# Patient Record
Sex: Female | Born: 1935 | Race: White | Hispanic: No
Health system: Southern US, Community
[De-identification: ages and names within clinical notes are randomized; demographics above are authoritative.]

---

## 2007-02-11 ENCOUNTER — Ambulatory Visit: Payer: Self-pay | Admitting: Emergency Medicine

## 2007-02-11 ENCOUNTER — Other Ambulatory Visit: Payer: Self-pay

## 2007-02-11 ENCOUNTER — Emergency Department: Payer: Self-pay | Admitting: Emergency Medicine

## 2007-09-19 ENCOUNTER — Other Ambulatory Visit: Payer: Self-pay

## 2007-09-19 ENCOUNTER — Emergency Department: Payer: Self-pay | Admitting: Emergency Medicine

## 2007-09-26 ENCOUNTER — Ambulatory Visit: Payer: Self-pay | Admitting: Family Medicine

## 2008-01-11 ENCOUNTER — Emergency Department: Payer: Self-pay | Admitting: Emergency Medicine

## 2008-01-11 ENCOUNTER — Other Ambulatory Visit: Payer: Self-pay

## 2008-02-04 ENCOUNTER — Ambulatory Visit: Payer: Self-pay | Admitting: Internal Medicine

## 2008-07-13 ENCOUNTER — Emergency Department: Payer: Self-pay | Admitting: Emergency Medicine

## 2008-07-16 ENCOUNTER — Ambulatory Visit: Payer: Self-pay | Admitting: Family Medicine

## 2008-08-13 ENCOUNTER — Ambulatory Visit: Payer: Self-pay | Admitting: Family Medicine

## 2008-09-13 ENCOUNTER — Ambulatory Visit: Payer: Self-pay | Admitting: Family Medicine

## 2010-04-22 ENCOUNTER — Emergency Department: Payer: Self-pay | Admitting: Emergency Medicine

## 2010-05-05 ENCOUNTER — Inpatient Hospital Stay: Payer: Self-pay | Admitting: Internal Medicine

## 2010-05-13 ENCOUNTER — Ambulatory Visit: Payer: Self-pay | Admitting: Cardiovascular Disease

## 2010-12-23 ENCOUNTER — Emergency Department: Payer: Self-pay | Admitting: Emergency Medicine

## 2011-05-13 ENCOUNTER — Emergency Department: Payer: Self-pay | Admitting: Emergency Medicine

## 2011-05-16 ENCOUNTER — Emergency Department: Payer: Self-pay | Admitting: Emergency Medicine

## 2011-05-22 ENCOUNTER — Ambulatory Visit: Payer: Self-pay

## 2012-05-06 LAB — URINALYSIS, COMPLETE
Bacteria: NONE SEEN
Glucose,UR: NEGATIVE mg/dL (ref 0–75)
Nitrite: NEGATIVE
RBC,UR: 3 /HPF (ref 0–5)
Squamous Epithelial: 1
WBC UR: 4 /HPF (ref 0–5)

## 2012-05-06 LAB — CK TOTAL AND CKMB (NOT AT ARMC): CK, Total: 723 U/L — ABNORMAL HIGH (ref 21–215)

## 2012-05-06 LAB — COMPREHENSIVE METABOLIC PANEL
Albumin: 3 g/dL — ABNORMAL LOW (ref 3.4–5.0)
Alkaline Phosphatase: 62 U/L (ref 50–136)
Anion Gap: 8 (ref 7–16)
BUN: 31 mg/dL — ABNORMAL HIGH (ref 7–18)
Bilirubin,Total: 0.6 mg/dL (ref 0.2–1.0)
Creatinine: 2.12 mg/dL — ABNORMAL HIGH (ref 0.60–1.30)
EGFR (African American): 23 — ABNORMAL LOW
Glucose: 110 mg/dL — ABNORMAL HIGH (ref 65–99)
Potassium: 4.6 mmol/L (ref 3.5–5.1)
SGOT(AST): 48 U/L — ABNORMAL HIGH (ref 15–37)
SGPT (ALT): 23 U/L (ref 12–78)
Total Protein: 7.3 g/dL (ref 6.4–8.2)

## 2012-05-06 LAB — CBC
HCT: 32.4 % — ABNORMAL LOW (ref 35.0–47.0)
HGB: 11.3 g/dL — ABNORMAL LOW (ref 12.0–16.0)
MCH: 29.2 pg (ref 26.0–34.0)
MCHC: 34.7 g/dL (ref 32.0–36.0)
MCV: 84 fL (ref 80–100)
RBC: 3.85 10*6/uL (ref 3.80–5.20)

## 2012-05-07 ENCOUNTER — Inpatient Hospital Stay: Payer: Self-pay | Admitting: Internal Medicine

## 2012-05-07 LAB — COMPREHENSIVE METABOLIC PANEL
Albumin: 2.8 g/dL — ABNORMAL LOW (ref 3.4–5.0)
Anion Gap: 10 (ref 7–16)
BUN: 28 mg/dL — ABNORMAL HIGH (ref 7–18)
Bilirubin,Total: 0.4 mg/dL (ref 0.2–1.0)
Chloride: 93 mmol/L — ABNORMAL LOW (ref 98–107)
Co2: 24 mmol/L (ref 21–32)
Creatinine: 2.06 mg/dL — ABNORMAL HIGH (ref 0.60–1.30)
EGFR (African American): 24 — ABNORMAL LOW
EGFR (Non-African Amer.): 21 — ABNORMAL LOW
Glucose: 89 mg/dL (ref 65–99)
Osmolality: 260 (ref 275–301)
Potassium: 4.5 mmol/L (ref 3.5–5.1)
SGPT (ALT): 21 U/L (ref 12–78)

## 2012-05-07 LAB — HEMOGLOBIN: HGB: 11.9 g/dL — ABNORMAL LOW (ref 12.0–16.0)

## 2012-05-07 LAB — TROPONIN I: Troponin-I: 0.32 ng/mL — ABNORMAL HIGH

## 2012-05-08 ENCOUNTER — Ambulatory Visit: Payer: Self-pay | Admitting: Internal Medicine

## 2012-05-08 LAB — BASIC METABOLIC PANEL
BUN: 22 mg/dL — ABNORMAL HIGH (ref 7–18)
Chloride: 100 mmol/L (ref 98–107)
Creatinine: 1.56 mg/dL — ABNORMAL HIGH (ref 0.60–1.30)
Glucose: 91 mg/dL (ref 65–99)
Potassium: 4.1 mmol/L (ref 3.5–5.1)
Sodium: 132 mmol/L — ABNORMAL LOW (ref 136–145)

## 2012-05-09 LAB — CBC WITH DIFFERENTIAL/PLATELET
Basophil #: 0 10*3/uL (ref 0.0–0.1)
HCT: 31.7 % — ABNORMAL LOW (ref 35.0–47.0)
MCH: 28 pg (ref 26.0–34.0)
MCHC: 33.1 g/dL (ref 32.0–36.0)
MCV: 84 fL (ref 80–100)
Monocyte #: 0.8 x10 3/mm (ref 0.2–0.9)
Monocyte %: 14 %
Neutrophil #: 3.9 10*3/uL (ref 1.4–6.5)
Platelet: 241 10*3/uL (ref 150–440)
RDW: 14.6 % — ABNORMAL HIGH (ref 11.5–14.5)

## 2012-05-09 LAB — BASIC METABOLIC PANEL
BUN: 17 mg/dL (ref 7–18)
Chloride: 102 mmol/L (ref 98–107)
Co2: 25 mmol/L (ref 21–32)
Creatinine: 1.38 mg/dL — ABNORMAL HIGH (ref 0.60–1.30)
EGFR (African American): 39 — ABNORMAL LOW
EGFR (Non-African Amer.): 33 — ABNORMAL LOW
Glucose: 75 mg/dL (ref 65–99)
Osmolality: 268 (ref 275–301)
Potassium: 3.9 mmol/L (ref 3.5–5.1)

## 2012-05-09 LAB — URINE CULTURE

## 2012-05-11 LAB — BASIC METABOLIC PANEL
Anion Gap: 9 (ref 7–16)
BUN: 14 mg/dL (ref 7–18)
Chloride: 104 mmol/L (ref 98–107)
Creatinine: 1.25 mg/dL (ref 0.60–1.30)
EGFR (African American): 44 — ABNORMAL LOW
EGFR (Non-African Amer.): 38 — ABNORMAL LOW
Glucose: 97 mg/dL (ref 65–99)
Osmolality: 274 (ref 275–301)

## 2012-05-11 LAB — HEMOGLOBIN: HGB: 11 g/dL — ABNORMAL LOW (ref 12.0–16.0)

## 2012-05-12 LAB — CULTURE, BLOOD (SINGLE)

## 2012-05-14 ENCOUNTER — Ambulatory Visit: Payer: Self-pay | Admitting: Internal Medicine

## 2012-10-01 ENCOUNTER — Emergency Department: Payer: Self-pay | Admitting: Emergency Medicine

## 2012-10-01 LAB — URINALYSIS, COMPLETE
Bilirubin,UR: NEGATIVE
Ketone: NEGATIVE
Leukocyte Esterase: NEGATIVE
Nitrite: NEGATIVE
Protein: NEGATIVE
RBC,UR: <1 /HPF (ref 0–5)
Squamous Epithelial: <1
WBC UR: <1 /HPF (ref 0–5)

## 2012-10-01 LAB — COMPREHENSIVE METABOLIC PANEL
Albumin: 3.2 g/dL — ABNORMAL LOW (ref 3.4–5.0)
Alkaline Phosphatase: 55 U/L (ref 50–136)
Anion Gap: 7 (ref 7–16)
Chloride: 105 mmol/L (ref 98–107)
Co2: 24 mmol/L (ref 21–32)
EGFR (African American): 35 — ABNORMAL LOW
EGFR (Non-African Amer.): 30 — ABNORMAL LOW
Glucose: 82 mg/dL (ref 65–99)
SGOT(AST): 53 U/L — ABNORMAL HIGH (ref 15–37)
SGPT (ALT): 20 U/L (ref 12–78)
Total Protein: 7.4 g/dL (ref 6.4–8.2)

## 2012-10-01 LAB — CBC
HCT: 35.8 % (ref 35.0–47.0)
MCH: 26.1 pg (ref 26.0–34.0)
MCHC: 32.8 g/dL (ref 32.0–36.0)
MCV: 80 fL (ref 80–100)
Platelet: 269 10*3/uL (ref 150–440)
RBC: 4.5 10*6/uL (ref 3.80–5.20)
RDW: 14.9 % — ABNORMAL HIGH (ref 11.5–14.5)

## 2012-10-01 LAB — LIPASE, BLOOD: Lipase: 72 U/L — ABNORMAL LOW (ref 73–393)

## 2012-11-20 ENCOUNTER — Ambulatory Visit: Payer: Self-pay

## 2013-02-23 ENCOUNTER — Inpatient Hospital Stay: Payer: Self-pay | Admitting: Internal Medicine

## 2013-02-23 LAB — URINALYSIS, COMPLETE
Bilirubin,UR: NEGATIVE
Nitrite: POSITIVE
Ph: 5 (ref 4.5–8.0)
RBC,UR: 2 /HPF (ref 0–5)
Specific Gravity: 1.013 (ref 1.003–1.030)
Squamous Epithelial: 1

## 2013-02-23 LAB — COMPREHENSIVE METABOLIC PANEL
Albumin: 2.9 g/dL — ABNORMAL LOW (ref 3.4–5.0)
Alkaline Phosphatase: 49 U/L — ABNORMAL LOW (ref 50–136)
Anion Gap: 9 (ref 7–16)
BUN: 32 mg/dL — ABNORMAL HIGH (ref 7–18)
Creatinine: 1.75 mg/dL — ABNORMAL HIGH (ref 0.60–1.30)
EGFR (African American): 29 — ABNORMAL LOW
Potassium: 4.4 mmol/L (ref 3.5–5.1)
SGPT (ALT): 29 U/L (ref 12–78)
Total Protein: 6.5 g/dL (ref 6.4–8.2)

## 2013-02-23 LAB — TROPONIN I: Troponin-I: <0.02 ng/mL

## 2013-02-23 LAB — PROTIME-INR
INR: 1
Prothrombin Time: 13.2 secs (ref 11.5–14.7)

## 2013-02-23 LAB — CBC
HCT: 34.1 % — ABNORMAL LOW (ref 35.0–47.0)
HGB: 11.3 g/dL — ABNORMAL LOW (ref 12.0–16.0)
MCV: 81 fL (ref 80–100)
RBC: 4.19 10*6/uL (ref 3.80–5.20)
RDW: 18.1 % — ABNORMAL HIGH (ref 11.5–14.5)

## 2013-02-23 LAB — TSH: Thyroid Stimulating Horm: 1.54 u[IU]/mL

## 2013-02-23 LAB — CK TOTAL AND CKMB (NOT AT ARMC): CK-MB: 1.6 ng/mL (ref 0.5–3.6)

## 2013-02-24 LAB — BASIC METABOLIC PANEL
Anion Gap: 8 (ref 7–16)
BUN: 25 mg/dL — ABNORMAL HIGH (ref 7–18)
Calcium, Total: 9.1 mg/dL (ref 8.5–10.1)
Chloride: 108 mmol/L — ABNORMAL HIGH (ref 98–107)
Co2: 24 mmol/L (ref 21–32)
EGFR (African American): 29 — ABNORMAL LOW
Glucose: 78 mg/dL (ref 65–99)
Osmolality: 283 (ref 275–301)
Potassium: 4 mmol/L (ref 3.5–5.1)
Sodium: 140 mmol/L (ref 136–145)

## 2013-02-24 LAB — CBC WITH DIFFERENTIAL/PLATELET
Eosinophil #: 0.1 10*3/uL (ref 0.0–0.7)
Eosinophil %: 1.5 %
HCT: 33.9 % — ABNORMAL LOW (ref 35.0–47.0)
HGB: 11.8 g/dL — ABNORMAL LOW (ref 12.0–16.0)
Lymphocyte %: 30.9 %
MCH: 28.1 pg (ref 26.0–34.0)
Monocyte #: 0.7 x10 3/mm (ref 0.2–0.9)
Monocyte %: 11.9 %
Neutrophil #: 3 10*3/uL (ref 1.4–6.5)
Neutrophil %: 54.9 %
Platelet: 210 10*3/uL (ref 150–440)
RBC: 4.21 10*6/uL (ref 3.80–5.20)
RDW: 17.4 % — ABNORMAL HIGH (ref 11.5–14.5)
WBC: 5.5 10*3/uL (ref 3.6–11.0)

## 2013-02-25 LAB — BASIC METABOLIC PANEL
Anion Gap: 5 — ABNORMAL LOW (ref 7–16)
Calcium, Total: 8.7 mg/dL (ref 8.5–10.1)
Chloride: 108 mmol/L — ABNORMAL HIGH (ref 98–107)
Co2: 27 mmol/L (ref 21–32)
EGFR (African American): 31 — ABNORMAL LOW
EGFR (Non-African Amer.): 27 — ABNORMAL LOW
Potassium: 3.8 mmol/L (ref 3.5–5.1)
Sodium: 140 mmol/L (ref 136–145)

## 2013-02-26 LAB — BASIC METABOLIC PANEL
Anion Gap: 8 (ref 7–16)
Calcium, Total: 8.6 mg/dL (ref 8.5–10.1)
EGFR (African American): 31 — ABNORMAL LOW
EGFR (Non-African Amer.): 27 — ABNORMAL LOW
Glucose: 95 mg/dL (ref 65–99)
Sodium: 141 mmol/L (ref 136–145)

## 2013-02-28 LAB — BASIC METABOLIC PANEL
Anion Gap: 7 (ref 7–16)
BUN: 14 mg/dL (ref 7–18)
Chloride: 113 mmol/L — ABNORMAL HIGH (ref 98–107)
Co2: 24 mmol/L (ref 21–32)
Creatinine: 1.49 mg/dL — ABNORMAL HIGH (ref 0.60–1.30)
EGFR (Non-African Amer.): 30 — ABNORMAL LOW
Osmolality: 286 (ref 275–301)
Potassium: 3.4 mmol/L — ABNORMAL LOW (ref 3.5–5.1)

## 2013-03-01 LAB — CULTURE, BLOOD (SINGLE)

## 2013-07-07 ENCOUNTER — Emergency Department: Payer: Self-pay | Admitting: Emergency Medicine

## 2013-07-08 LAB — COMPREHENSIVE METABOLIC PANEL
Anion Gap: 8 (ref 7–16)
Calcium, Total: 8.9 mg/dL (ref 8.5–10.1)
Chloride: 106 mmol/L (ref 98–107)
Co2: 26 mmol/L (ref 21–32)
Glucose: 105 mg/dL — ABNORMAL HIGH (ref 65–99)
Osmolality: 284 (ref 275–301)
Potassium: 3.9 mmol/L (ref 3.5–5.1)
SGPT (ALT): 20 U/L (ref 12–78)
Sodium: 140 mmol/L (ref 136–145)
Total Protein: 6.5 g/dL (ref 6.4–8.2)

## 2013-07-08 LAB — CBC
HCT: 36.9 % (ref 35.0–47.0)
HGB: 12.4 g/dL (ref 12.0–16.0)
MCH: 27.3 pg (ref 26.0–34.0)
MCHC: 33.6 g/dL (ref 32.0–36.0)
RDW: 15.8 % — ABNORMAL HIGH (ref 11.5–14.5)

## 2013-07-08 LAB — URINALYSIS, COMPLETE
Ketone: NEGATIVE
Nitrite: POSITIVE
Protein: NEGATIVE
WBC UR: 4 /HPF (ref 0–5)

## 2013-07-08 LAB — LIPASE, BLOOD: Lipase: 70 U/L — ABNORMAL LOW (ref 73–393)

## 2014-02-05 IMAGING — CR DG CHEST 1V PORT
1 series · 1 of 1 positions shown · non-contrast
Comparison: none

REASON FOR EXAM: Chest Pain
COMMENTS:

PROCEDURE:     DXR - DXR PORTABLE CHEST SINGLE VIEW  - February 23, 2013 [DATE]
RESULT:     Comparison: 05/06/2012, 05/22/2011

[ap]
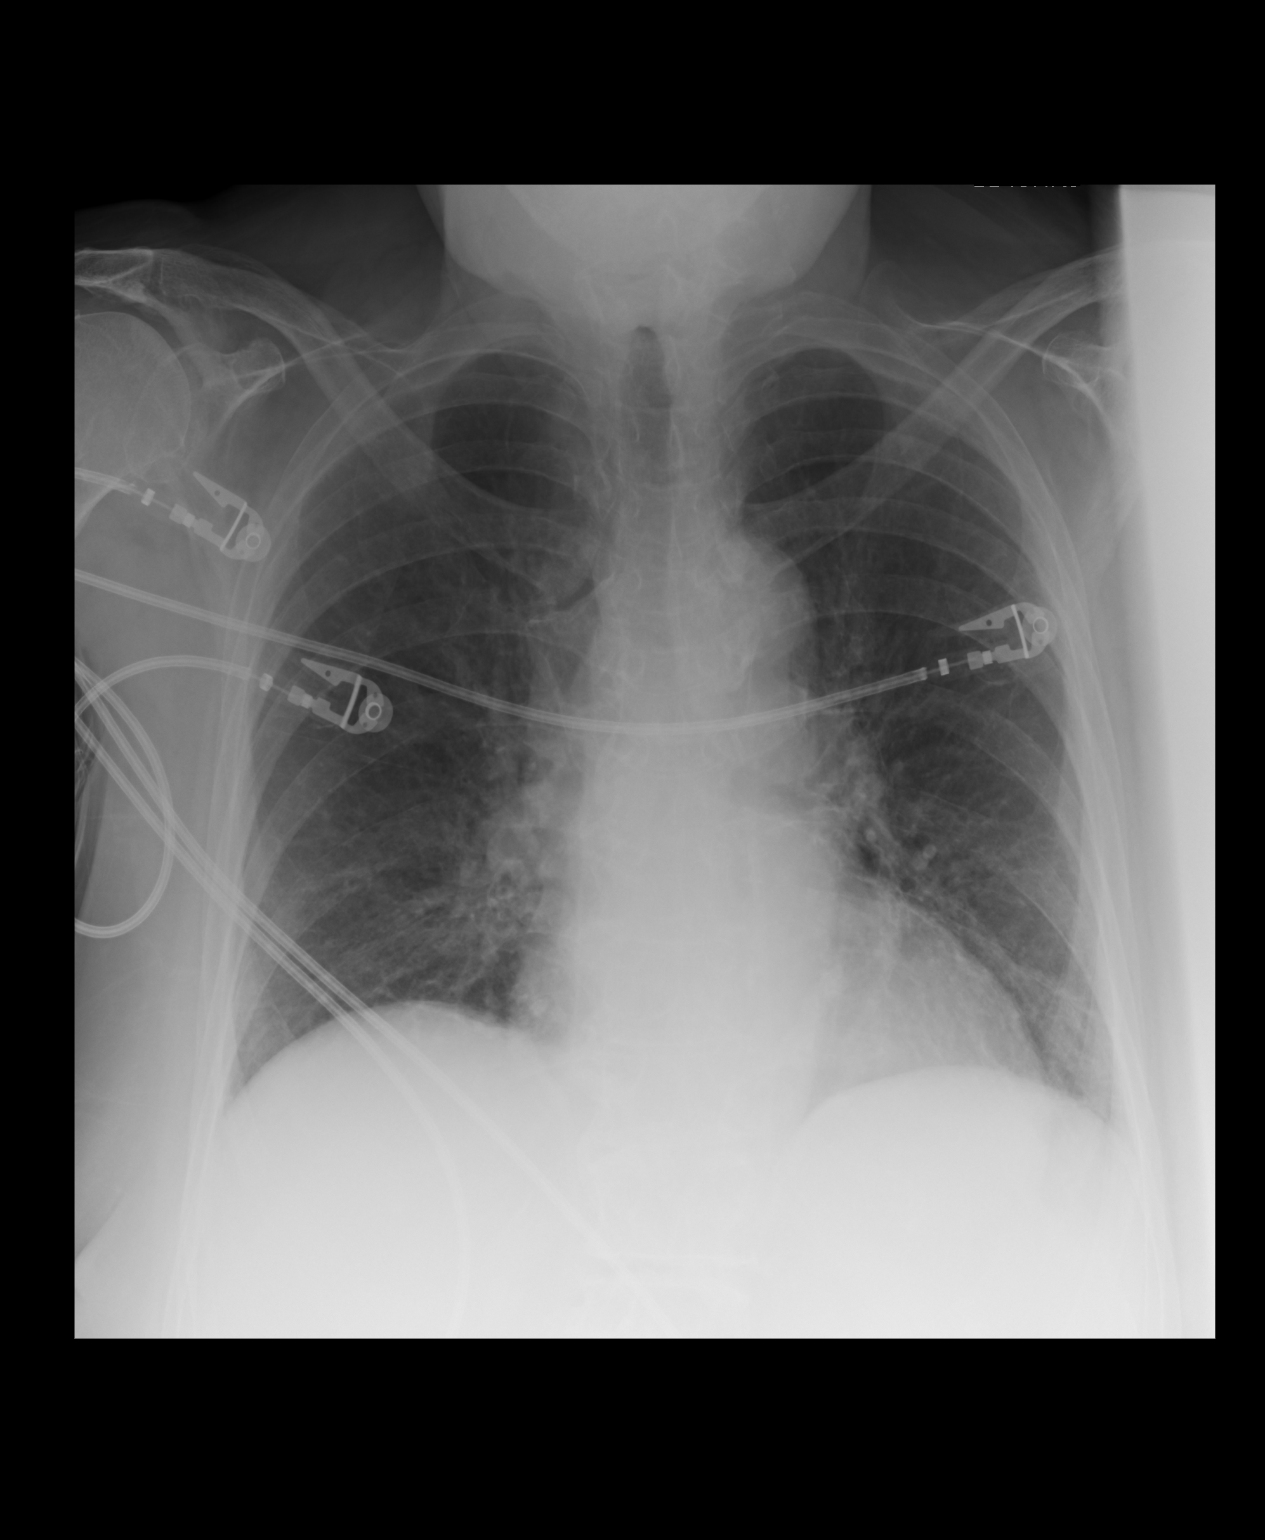

[1 of 1 positions shown; findings below may reference images not displayed]

FINDINGS: Single portable AP chest radiograph is provided. There is hazy left lower
lobe airspace disease. There is no pleural effusion or pneumothorax. Normal
cardiomediastinal silhouette. The osseous structures are unremarkable.
IMPRESSION: Hazy left lower lobe airspace disease which may reflect atelectasis versus
pneumonitis versus chronic interstitial disease. Recommend followup
radiography to document complete resolution following adequate medical
therapy.

[REDACTED]

## 2014-12-31 NOTE — Discharge Summary (Signed)
PATIENT NAME:  Amanda Steele, Amanda Steele MR#:  [id number] DATE OF BIRTH:  Feb 23, 1919  DATE OF ADMISSION:  05/07/2012 DATE OF DISCHARGE:  05/12/2012  ADMITTING DIAGNOSES:  1. Altered mental status. 2. Right upper lobe bacterial pneumonia.  3. Hypertension.  4. Hypothyroidism.   DISCHARGE DIAGNOSES:  1. Delirium. 2. Aspiration pneumonia. 3. Dysphagia.  4. Urinary retention. Patient will be using Foley catheter from now on, chronic indwelling catheter.  5. Acute renal failure.  6. Hyponatremia.  7. Dehydration, resolved on intravenous fluids. 8. Generalized weakness. 9. History of hypertension. 10. Cerebrovascular accident. 11. Hypothyroidism.  12. Dementia.  13. Failure to thrive.   DISCHARGE CONDITION: Guarded.   DISCHARGE MEDICATIONS: Patient is to resume her outpatient medications and continue to those medications prescribed. 1. Synthroid 25 mcg p.o. daily.  2. Aspirin 81 mg p.o. daily.  3. Aloe Vera oral capsule 2 capsules once daily.  4. Zyrtec 10 mg p.o. daily. 5. Namenda 10 mg p.o. twice daily.  6. Pantoprazole 20 mg p.o. daily.  7. Citalopram 10 mg p.o. daily.  8. Amlodipine 5 mg p.o. daily.  9. Augmentin 400/57 mg 1 tablet every eight hours for five more days.  10. Patient is to stop amitriptyline as well as homatropine/hydrocodone.   HOME OXYGEN: None.   DIET: 2 grams salt, low fat, low cholesterol, pureed honey-thick liquids. Aspiration precautions with feedings at all meals.   ACTIVITY LIMITATIONS: As tolerated.   REFERRAL: Hospice at home.   FOLLOW UP: Follow-up appointment with Dr. [name] in two days after discharge.   DISCHARGE INSTRUCTIONS: Patient's Foley catheter should be kept in place. Hospice it to replace it as needed.   HISTORY OF PRESENT ILLNESS: Patient is a 81 year old Caucasian female who has been declining over a period of time now presents to the hospital with complaints of altered mental status, also cough, low-grade fevers. Please refer to Dr.  Riley Steele's admission note on 05/07/2012.  On arrival to the hospital patient's blood pressure 120/56, respiration rate 18, pulse 69, temperature 99.6, oxygen saturation 94% oxygen therapy. Physical examination was unremarkable. Patient was demented, not providing much of history or communicating.   LABORATORY, DIAGNOSTIC AND RADIOLOGICAL DATA: Glucose 110, BUN 31, creatinine 2.12, sodium 124, potassium 4.6, estimated GFR for non-African American would be 20. Liver enzymes show albumin level 3.0, AST slightly elevated at 48. Patient's CK total was high as 723, MB fraction 9.0 and troponin was 0.76. Repeated troponin was 0.45 and third set of troponin was 0.32. Patient's TSH was normal at 3.53. CBC: White blood cell count 8.8, hemoglobin 11.3, platelet count 188. Patient's blood cultures x3 did not show any growth as well as urine cultures. Urinalysis showed yellow cloudy urine, negative for glucose, bilirubin or ketones, specific gravity 1.012, pH 5.0, 1+ blood, 30 mg/dL protein, negative for nitrites, trace leukocyte esterase, 3 red blood cells, 4 white blood cells, no bacteria, 1 epithelial cell, 6 hyaline casts and amorphous crystal was present. EKG showed normal sinus rhythm at 70 beats minutes, possible anterior infarct which is already cited in 2012. Abnormal EKG but no significant change since prior EKG done in September 2012. Patient's chest x-ray done on 05/06/2012 PA and lateral revealed findings consistent with interstitial infiltrate with linear component in the right lung apex, right and left lung base regions. These areas may alternatively may represent areas of discoid  atelectasis. No focal regions of consolidation were identified. Repeat evaluation post appropriate therapeutic regimen was recommended. A CT scan of her head without contrast because  of altered mental status showed no acute abnormality.   HOSPITAL COURSE: Patient was admitted to the hospital, started on therapy for suspected  aspiration pneumonitis. She was evaluated by speech therapist who recommended puree diet and honey thick liquids. Patient underwent CT scanning of her chest without contrast 05/08/2012 which showed right upper lobe infectious versus malignant process. Correlate with clinical and laboratory data according to radiologist. Follow-up chest x-ray to document clearing would be recommended. Bilateral pleural effusions were present and small amount on the right and trace amount on the left. Additional areas of atelectasis or minimal infiltrate were seen in both lower lobes as well. Study was again degraded by patient's respiratory motion artifact. Because of right upper lobe abnormalities concerning for cancer consultation with Dr. Freda Steele was obtained. Consultant felt that patient could have reactivation of tuberculosis so patient was placed in respiratory isolation and had PPD placed which was negative. She also had QuantiFERON checked which was also negative. As those studies were negative it was felt that patient likely had just aspiration pneumonitis itself and patient's antibiotic was changed to Augmentin. To further evaluate reasons of her altered mental status and rule out cerebrovascular accident MRI was performed. MRI of brain without contrast done on 05/08/2012 showed atrophy with chronic small vessel ischemic disease but no evidence of acute or subacute ischemic event or evidence of hemorrhage. It was felt that patient has been declining and very likely dementia is responsible for her decline as well as aspiration pneumonitis. Patient was advised to continue antibiotics for a few more days as well as strict puree diet with aspiration precautions. Patient was felt to be candidate for hospice care and consultation with palliative care physician, Dr. Harvie Steele, was obtained. Family was agreeable for patient to go home with her son and follow up with hospice care at home. Patient is being discharged with hospice  care. Of note, patient was noted to have significant urinary retention. Initially patient's urinary retention was attributed to amitriptyline, however, after holding amitriptyline for a few days patient's urinary retention recurred and patient had to have Foley catheter placed. It was felt because of her physical decline that patient should continue Foley catheter as outpatient indefinitely. Her Foley catheter should be taken care of by hospice nursing staff. Patient is being discharged in stable cardiac condition with above-mentioned medications and follow up. Her temperature on the day of discharge is ranging from 98.2 to 99.1, pulse 60s to 70s, respiration rate 18 to 19, blood pressure around 130/60s to 70s, oxygen saturation 93% to 94% on room air at rest.     TIME SPENT: 40 minutes.    ____________________________ Katharina Caper, MD rv:cms D: 05/13/2012 14:46:26 ET T: 05/16/2012 11:29:30 ET JOB#: [id number]  cc: Katharina Caper, MD, <Dictator> Burley Saver, MD Tallin Hart MD ELECTRONICALLY SIGNED 05/24/2012 22:11

## 2014-12-31 NOTE — H&P (Signed)
PATIENT NAME:  Amanda Steele, Amanda Steele MR#:  [id number] DATE OF BIRTH:  [birth year]  DATE OF ADMISSION:  05/07/2012  PRIMARY CARE PHYSICIAN: Dr. Clayborn Bigness   REFERRING PHYSICIAN: Dr. Margarita Grizzle    CHIEF COMPLAINT: Altered mental status, cough, and low-grade fever.   HISTORY OF PRESENT ILLNESS: Amanda Steele is a 81 year old Caucasian female with a history of dementia, systemic hypertension, hypothyroidism, chronic kidney disease, and history of stroke. The patient lives at home with her son and she was noticed to have altered mental status. She has decreased oral intake, not eating and drinking as usual. Her speech is a little slurred. Her walk looks different and she's weak. Her conversation is different and noted to have cough and low-grade fever. The patient was brought here for evaluation and chest x-ray revealed right upper lobe consolidation consistent with pneumonia. Her sodium was low at 124. Troponin was noted to be elevated at 0.7. Her total CPK is also elevated to 700 consistent with rhabdomyolysis as well. The patient was admitted for further evaluation and management. It is also noted that her creatinine is elevated. The baseline is 1.5 a year ago and now it is 2.1.   REVIEW OF SYSTEMS: 10 point system review was unobtainable due to her dementia.   PAST MEDICAL HISTORY:  1. Dementia. 2. Cerebrovascular accident.  3. Systemic hypertension. 4. Hypothyroidism. 5. Chronic kidney disease, stage III. 6. History of arthritis.   PAST SURGICAL HISTORY:  1. Appendectomy.  2. History of colostomy. The indication is unknown.   SOCIAL HABITS: Nonsmoker. No history of alcohol abuse.   SOCIAL HISTORY: She lives at home with her son.   FAMILY HISTORY: According to the medical records, her mother suffered from stroke. No other significant family history of any illness.   ADMISSION MEDICATIONS:  1. Citalopram 10 mg a day. 2. Levothyroxine 25 mcg once a day. 3. Pantoprazole 20 mg a  day. 4. Zyrtec 10 mg a day. 5. Amitriptyline 25 mg at night. 6. Oxybutynin 5 mg twice a day. 7. Namenda 10 mg twice a day.  8. Recently started on cough syrup using homatropine with hydrocodone p.r.n.   ALLERGIES: She is allergic to Valium which affects her mental status and codeine of unknown reaction. Also, hydrocodone. Of note, her cough syrup contains hydrocodone.   PHYSICAL EXAMINATION:   VITAL SIGNS: Blood pressure 120/56, respiratory rate 18, pulse 69, temperature 99.6, oxygen saturation 94%.   GENERAL APPEARANCE: Elderly lady lying in bed in no acute distress.   HEAD: No pallor. No icterus. No cyanosis.   EARS, NOSE, AND THROAT: Hearing was normal. Nasal mucosa, lips, tongue were normal.   EYES: Normal iris and conjunctivae. Pupils about 4 to 5 mm, equal and sluggishly reactive to light.   NECK: Supple. Trachea at midline. No thyromegaly. No cervical lymphadenopathy. No masses.   HEART: Normal S1, S2. No S3, S4. No murmur. No gallop. No carotid bruits.   RESPIRATORY: Normal breathing pattern without use of accessory muscles. No rales. No wheezing.   ABDOMEN: Soft without tenderness. No hepatosplenomegaly. No masses. No hernias.   SKIN: No ulcers. No subcutaneous nodules.   MUSCULOSKELETAL: No joint swelling. No clubbing.   NEUROLOGIC: Cranial nerves II through XII are intact. No focal motor deficit.   PSYCHIATRIC: The patient has total dementia. This appears worse. She cannot recognize her grandson who is next to her bed, whereas her baseline usually she knows him by name. She is disoriented to place and time as well. Mood and affect  were normal.   LABORATORY, DIAGNOSTIC, AND RADIOLOGICAL DATA: CAT scan of the head showed no acute intracranial abnormality. There are white matter changes consistent with chronic ischemia.   Her chest x-ray showed linear consolidation at the right upper lobe consistent with pneumonia. I cannot rule out underlying mass. There is also a  linear density at the right lower lung zone, may indicate some atelectasis.   EKG showed normal sinus rhythm at rate of 70 per minute. Poor progression of R waves in the anterior chest leads, otherwise unremarkable EKG.  Serum glucose 110, BUN 31, creatinine 2.1. Her baseline creatinine in September 2012 was 1.5. Sodium 124, potassium 4.6. Her estimated GFR is 20. Calculated serum osmolality 257. Liver function tests were normal except for low albumin at 3.0 and 48 of AST with normal ALT at 23. CPK is elevated at 723. Troponin elevated at 0.7. TSH was normal at 3.5. CBC showed white count of 8000, hemoglobin 11, hematocrit 32, platelet count 188. Urinalysis was unremarkable.   ASSESSMENT:  1. Altered mental status. 2. Right upper lobe consolidation consistent with pneumonia.  3. Hyponatremia.  4. Elevated troponin.  5. Rhabdomyolysis.  6. Acute on chronic renal failure.  7. Dementia.  8. Systemic hypertension.  9. History of stroke.  10. Hypothyroidism.   PLAN:  1. Admit to the medical floor.  2. Follow-up on neurologic status.  3. Blood cultures were taken and urine culture as well.  4. IV antibiotics using Rocephin and Zithromax.  5. Gentle IV hydration with normal saline and follow-up on her kidney function. Hopefully the creatinine will improve.  6. Follow-up on sodium. If there is no improvement in the sodium, then she will need work-up for syndrome of inappropriate ADH secretion.  7. The elevated troponin could be from her rhabdomyolysis in addition to the worsening of her kidney function. However, I will follow-up on her troponin q.8 hours times additional two samples. Meanwhile, I will increase her aspirin to 325 mg a day.   I discussed the findings with her grandson and answered his questions. He informs me that she does have a LIVING WILL and she had appointed her son to have the power-of-attorney.   CODE STATUS: FULL CODE.   TIME SPENT: Time Spent evaluating this patient and  reviewing medical records took more than 55 minutes.   ____________________________ Carney CornersAmir M. Rudene Rearwish, MD amd:drc D: 05/07/2012 01:07:37 ET T: 05/07/2012 09:37:56 ET JOB#: [id number]  cc: Carney CornersAmir M. Rudene Rearwish, MD, <Dictator> Burley SaverL. Katherine Bliss, MD Karolee OhsAMIR Dala DockM Rafay Dahan MD ELECTRONICALLY SIGNED 05/07/2012 22:29

## 2015-01-03 NOTE — Consult Note (Signed)
PATIENT NAME:  Amanda Steele, Amanda Steele DATE OF BIRTH:  Jul 30, 1919  DATE OF CONSULTATION:  02/28/2013  CONSULTING PHYSICIAN:  Izola PriceFrances C. Jaclynn MajorGreason, MD  CHIEF COMPLAINT:  Amanda Steele is unable to answer questions. Her thinking processes are not linear, nor logical. She appears to be a very pleasant, cooperative woman.   HISTORY OF PRESENT ILLNESS: This is a 81 year old female who has vascular dementia. She was brought into the hospital on 02/23/2013 and her son, with whom she lives, was also brought in as he has a diagnosis of paranoid schizophrenia. She, at that, time had a heart rate of 50, was in acute renal failure. She was diagnosed later in her hospitalization for lower lobe pneumonia.   HISTORY OF PSYCHIATRIC ILLNESS: There is no one that can give me a history of her psychiatric history, so I do not know that she has one. I also do not know that she came in on medications. However, her medications, when I saw her, were Remeron 15 mg at bedtime, cetirizine 10 mg p.o. daily, enteric-coated aspirin 81 mg p.o. daily, Namenda 10 mg p.o. b.i.d. and pantoprazole 20 mg p.o. daily.   IT SHOULD BE NOTED THAT HER ALLERGIES ARE VALIUM THAT ALTERS HER MENTAL STATUS, CODEINE AND HYDROCODONE.   During her stay on the unit, she was diagnosed with:  1.  ARS on CKD, dehydration.  2.  Left lower lobe pneumonia.  3. Severe vascular dementia.   As I said, Amanda Steele is unable to participate in the interview. She talks in a nonlinear nonlogical fashion and it is clear that she does not understand my questions.   RECOMMENDATIONS:   1.  I recommend a skilled nursing facility, such as [facility] for Amanda Steele.  2.  I understand that [place] and that  power of attorney will be transferred to her daughter. I believe that Amanda Steele lacks the capacity to be in charge of her own affairs.   ____________________________ Izola PriceFrances C. Jaclynn MajorGreason,  MD fcg:cc D: 03/19/2013 13:16:48 ET T: 03/19/2013 14:10:37 ET JOB#: [id number]  cc: Izola PriceFrances C. Jaclynn MajorGreason, MD, <Dictator> Maryan PulsFRANCES C Jayvan Mcshan MD ELECTRONICALLY SIGNED 03/21/2013 10:49

## 2015-01-03 NOTE — H&P (Signed)
PATIENT NAME:  Amanda Steele, Amanda Steele MR#:  [id number] DATE OF BIRTH:  1919/06/29  DATE OF ADMISSION:  02/23/2013  ADMITTING PHYSICIAN: Enid Baasadhika Jazzmin Newbold, MD  PRIMARY CARE PHYSICIAN: Burley SaverL. Katherine Bliss, MD  CHIEF COMPLAINT: Is brought in by EMS secondary to bradycardia and weakness.   HISTORY OF PRESENT ILLNESS: Amanda Steele is a 81 year old, pleasant Caucasian female with history of severe vascular dementia, history of hypertension and diverticulosis, status post colostomy done in the past, who lives at home with her son, was brought in by EMS this morning. The patient was unable to provide any history due to her dementia. Most of the history is obtained from her daughter and grandchildren at bedside. The patient has been living with her son for the past 2 years after she had a stroke. Her son, who is an ex-paramedic, has severe paranoid schizophrenia. He has been calling police every day with severe paranoid thoughts and this morning, he called police saying that he has been having auditory hallucinations asking him to kill his mother. Police arrived on scene and saw the patient appeared to be extremely weak in bed, so they called paramedics. Paramedics brought Amanda Steele to the ER. She was found to be bradycardic in the 50s and blood work here revealed acute renal failure and urinary tract infection. The patient's son was also involuntarily committed for his schizophrenia at this time.   PAST MEDICAL HISTORY: 1.  Severe vascular dementia.  2.  History of CVA with no residual neurological deficits.  3.  Chronic kidney disease.   PAST SURGICAL HISTORY: Colostomy done for recurrent diverticulitis.   ALLERGIES: CODEINE, HYDROCODONE AND VALIUM.  HOME MEDICATIONS:  1.  Vitamin D3, 1000 international units p.o. daily.  2.  Vitamin B12, 2000 mcg p.o. daily.  3.  Protonix 20 mg p.o. daily.  4.  Namenda 10 mg p.o. b.i.d.  5.  Homatropine/hydrocodone oral syrup 2.5 mL every 6 to 8 hours as needed.  6.   Coenzyme Q 100 mg capsule p.o. daily.  7.  Celexa 20 mg p.o. daily.  8.  Cetirizine 10 mg p.o. daily.  9.  Aspirin 81 mg p.o. daily.   SOCIAL HISTORY: The patient was living at home with her son. She also has a daughter, but the daughter and her family are afraid of the son so they have never been able to go and visit her recently. According to the daughter, the patient's dementia is very severe, only able to recognize family members' faces but not able to maintain a conversation. She is pleasantly confused at baseline. No history of any smoking or alcohol use.   FAMILY HISTORY: No significant family history.  REVIEW OF SYSTEMS: Difficult to be obtained secondary to the patient's dementia.   PHYSICAL EXAMINATION: VITAL SIGNS: Temperature 97.4 degrees Fahrenheit, pulse 54, respirations 16, blood pressure 147/66, pulse oximetry 95% on room air.  GENERAL: Well-built, well-nourished elderly female lying in bed, not in any acute distress.  HEENT: Normocephalic, atraumatic. Pupils equal, round, reacting to light. Anicteric sclerae. Extraocular movements intact. Oropharynx is clear without erythema, mass or exudates. Dry mucous membranes. Nasopharynx is clear without any lesions or discharge. Ears are clear without any lesions or discharge.  NECK: Supple. No thyromegaly, JVD or carotid bruits. No lymphadenopathy. No pain and full range of motion of neck is present.  LUNGS: Clear to auscultation bilaterally. No wheeze or crackles. No use of accessory muscles for breathing.  CARDIOVASCULAR: S1, S2, regular rate and rhythm, II/VI systolic murmur heard. No  rubs or gallops.  ABDOMEN: Soft, nontender, nondistended. Has left lower quadrant colostomy bag present with stool in it. Normal bowel sounds. No hepatosplenomegaly.  EXTREMITIES: No pedal edema, no clubbing or cyanosis, 1+ dorsalis pedis pulses palpable bilaterally.  SKIN: No acne, rash or lesions.  LYMPHATIC: No cervical lymphadenopathy.   NEUROLOGICAL: Cranial nerves II through XII are intact. She is able to move all 4 extremities without any focal deficits. Sensation seems to be intact as well.  PSYCHOLOGICAL: The patient is awake, alert, oriented to self.   LABORATORY DATA: WBC 5.6, hemoglobin 11.3, hematocrit 34.1, platelet count 202. Sodium 138, potassium 4.4, chloride 106, bicarbonate 23, BUN 32, creatinine 1.74, glucose 92 and calcium of 8.9. ALT is 29, AST 25, alkaline phosphatase 49, total bilirubin 0.4, albumin  2.9. First set of troponin is negative. TSH within normal limits at 1.54. INR 1.0. Urinalysis with 2+ leukocyte esterase, positive nitrite, few WBCs and trace bacteria present.   Chest x-ray revealing hazy left lower lobe airspace disease, which reflects atelectasis versus pneumonitis versus chronic interstitial disease.   EKG: Sinus bradycardia, heart rate of 55.   ASSESSMENT AND PLAN: This is a 81 year old female with history of severe vascular dementia, colostomy for diverticulitis, gastroesophageal reflux disease, who was living with her son with schizophrenia. Police was called today and police called EMS, as son was paranoid and having auditory hallucinations to kill his mother. When EMS arrived, the patient was weak, bradycardic, so she was brought to the Emergency Department. In the Emergency Department, she was found to have urinary tract infection, left lower lobe pneumonia and acute renal failure on labs.  1.  Acute renal failure, likely from dehydration: Gentle IV fluids. Recheck in morning. Avoid nephrotoxins. Known history of chronic kidney disease present.  2.  Urinary tract infection: Urine cultures ordered and started on Levaquin.  3.  Left lower lobe pneumonia: Could be chronic interstitial disease versus infiltrate. Blood cultures were drawn and she is on Levaquin at this time.  4.  Dementia: Continue Namenda. At baseline, she is pleasantly confused and can recognize family only.  5.  Physical  therapy consulted and might benefit from skilled nursing facility placement. She is not safe to go back home to live with her son who has paranoid schizophrenia. Son also is being evaluated for involuntary commitment and psych admission.   CODE STATUS: Full code.   TIME SPENT ON ADMISSION: 50 minutes.    ____________________________ Enid Baas, MD rk:jm D: 02/23/2013 15:57:03 ET T: 02/23/2013 16:33:34 ET JOB#: [id number]  cc: Enid Baas, MD, <Dictator> Burley Saver, MD Enid Baas MD ELECTRONICALLY SIGNED 03/06/2013 15:16

## 2015-01-03 NOTE — Discharge Summary (Signed)
PATIENT NAME:  Amanda Steele, [place] F MR#:  [id number] DATE OF BIRTH:  1919-02-24  DATE OF ADMISSION:  02/23/2013 DATE OF DISCHARGE:  02/28/2013   PRIMARY CARE PHYSICIAN: Burley SaverL. Katherine Bliss, MD  REASON FOR ADMISSION: The patient came in on the 13th, brought in by EMS with bradycardia, weakness.   HISTORY OF PRESENT ILLNESS: A 81 year old female with severe vascular dementia, hypertension, diverticulosis. Her son has paranoid schizophrenia, and the police were called, and he was brought in for psychiatric evaluation, and they also brought in Ms. Roettger and found her heart rate in the 50s, and she was in acute renal failure and a possibility of a urinary tract infection. She was started on IV Levaquin and given IV fluid hydration. Chest x-ray showed lower lobe pneumonia.   LABORATORY AND RADIOLOGICAL DATA DURING THE HOSPITAL COURSE: Included an EKG that showed sinus bradycardia. TSH 1.54. Glucose 92, BUN 32, creatinine 1.75, sodium 138, potassium 4.4, chloride 106, CO2 23, calcium 8.9. Liver function tests: Albumin low at 2.9. White blood cell count 5.6, H and H 11.3 and 34.1, platelet count of 202. INR 1.0. Urine culture only grew out 9000 gram-positive cocci, so I think that is a contaminant.  Urinalysis: 2+ leukocyte esterase and nitrite positive. Chest x-ray showed hazy left lower lobe airspace disease, may reflect atelectasis versus pneumonia. Blood cultures negative. Last creatinine 1.49. Last potassium 3.4.   HOSPITAL COURSE PER PROBLEM LIST:  1. For the patient's acute renal failure on chronic kidney disease and dehydration, the patient was given IV fluid hydration through the entire hospital course. Creatinine improved to 1.49, making her GFR 30, which is borderline chronic kidney disease stage IV, but I will call this chronic kidney disease stage III at this point.  2. For her pneumonia, left lower lobe, the patient was started on Levaquin and continued on that IV during the entire hospital course.  Will be switched over to oral to complete another 4 days.  3. Dementia. This is severe. She also did have encephalopathy secondary to dehydration and pneumonia, which seemed to improve. The patient is normally able to recognize some family. Today, the patient unable to tell me the President to [place], the year, the date, do 3-word recall. I think the patient has severe dementia and is unable to make good medical or legal decisions for herself at this point.  4. Depression. The patient was switched from Celexa to Remeron for better sleeping at night.   VITAL SIGNS ON DISCHARGE: Include a blood pressure 125/68, pulse 49, temperature 97.5, pulse oximetry 91% on room air, respirations 18.  CONDITION: Stable for discharge out of the hospital.    FOLLOWUP: With physician at facility.  CONSULTATION: Of note, we did call a psychiatric consultation for second opinion on the patient's mental capacity, which is still pending at the time of this dictation. Hopefully, the patient will be seen this morning prior to discharge.   TIME SPENT ON DISCHARGE: 35 minutes.   ____________________________ Herschell Dimesichard J. Renae GlossWieting, MD rjw:OSi D: 02/28/2013 10:06:18 ET T: 02/28/2013 11:00:13 ET JOB#: [id number]  cc: Herschell Dimesichard J. Renae GlossWieting, MD, <Dictator> Burley SaverL. Katherine Bliss, MD [facility]  Salley ScarletICHARD J Kensley Lares MD ELECTRONICALLY SIGNED 03/01/2013 13:15

## 2015-12-13 DEATH — deceased
